# Patient Record
Sex: Male | Born: 1935 | Race: White | Hispanic: Yes | Marital: Married
Health system: Southern US, Community
[De-identification: ages and names within clinical notes are randomized; demographics above are authoritative.]

---

## 2014-11-21 ENCOUNTER — Inpatient Hospital Stay
Admission: AD | Admit: 2014-11-21 | Discharge: 2014-11-27 | Disposition: A | Discharge status: Hospice | Payer: Self-pay | Source: Ambulatory Visit | Attending: Internal Medicine | Admitting: Internal Medicine

## 2014-11-21 DIAGNOSIS — Z4659 Encounter for fitting and adjustment of other gastrointestinal appliance and device: Secondary | ICD-10-CM

## 2014-11-21 DIAGNOSIS — J9690 Respiratory failure, unspecified, unspecified whether with hypoxia or hypercapnia: Secondary | ICD-10-CM

## 2014-11-21 DIAGNOSIS — I509 Heart failure, unspecified: Secondary | ICD-10-CM

## 2014-11-21 DIAGNOSIS — I1310 Hypertensive heart and chronic kidney disease without heart failure, with stage 1 through stage 4 chronic kidney disease, or unspecified chronic kidney disease: Secondary | ICD-10-CM

## 2014-11-21 LAB — BLOOD GAS, ARTERIAL
ACID-BASE EXCESS: 0.9 mmol/L (ref 0.0–2.0)
Bicarbonate: 25 mEq/L — ABNORMAL HIGH (ref 20.0–24.0)
DELIVERY SYSTEMS: POSITIVE
Drawn by: 129711
EXPIRATORY PAP: 8
FIO2: 0.5
INSPIRATORY PAP: 12
O2 SAT: 94.6 %
PCO2 ART: 40.5 mmHg (ref 35.0–45.0)
PO2 ART: 73.6 mmHg — AB (ref 80.0–100.0)
Patient temperature: 98.6
TCO2: 26.3 mmol/L (ref 0–100)
pH, Arterial: 7.407 (ref 7.350–7.450)

## 2014-11-22 ENCOUNTER — Other Ambulatory Visit (HOSPITAL_COMMUNITY): Payer: Self-pay

## 2014-11-22 ENCOUNTER — Institutional Professional Consult (permissible substitution) (HOSPITAL_COMMUNITY): Payer: Self-pay

## 2014-11-22 ENCOUNTER — Other Ambulatory Visit (HOSPITAL_BASED_OUTPATIENT_CLINIC_OR_DEPARTMENT_OTHER): Payer: Medicare Other

## 2014-11-22 DIAGNOSIS — I509 Heart failure, unspecified: Secondary | ICD-10-CM

## 2014-11-22 LAB — CK TOTAL AND CKMB (NOT AT ARMC)
CK TOTAL: 27 U/L — AB (ref 49–397)
CK, MB: 1.1 ng/mL (ref 0.5–5.0)
RELATIVE INDEX: INVALID (ref 0.0–2.5)

## 2014-11-22 LAB — CBC WITH DIFFERENTIAL/PLATELET
BASOS PCT: 0 % (ref 0–1)
Basophils Absolute: 0 10*3/uL (ref 0.0–0.1)
EOS ABS: 0 10*3/uL (ref 0.0–0.7)
Eosinophils Relative: 0 % (ref 0–5)
HCT: 26.6 % — ABNORMAL LOW (ref 39.0–52.0)
Hemoglobin: 8.3 g/dL — ABNORMAL LOW (ref 13.0–17.0)
Lymphocytes Relative: 6 % — ABNORMAL LOW (ref 12–46)
Lymphs Abs: 0.6 10*3/uL — ABNORMAL LOW (ref 0.7–4.0)
MCH: 28.5 pg (ref 26.0–34.0)
MCHC: 31.2 g/dL (ref 30.0–36.0)
MCV: 91.4 fL (ref 78.0–100.0)
Monocytes Absolute: 0.6 10*3/uL (ref 0.1–1.0)
Monocytes Relative: 6 % (ref 3–12)
NEUTROS PCT: 88 % — AB (ref 43–77)
NRBC: 0 /100{WBCs}
Neutro Abs: 8.7 10*3/uL — ABNORMAL HIGH (ref 1.7–7.7)
PLATELETS: 304 10*3/uL (ref 150–400)
RBC: 2.91 MIL/uL — AB (ref 4.22–5.81)
RDW: 19.3 % — ABNORMAL HIGH (ref 11.5–15.5)
WBC: 10 10*3/uL (ref 4.0–10.5)

## 2014-11-22 LAB — MAGNESIUM: Magnesium: 2.6 mg/dL — ABNORMAL HIGH (ref 1.7–2.4)

## 2014-11-22 LAB — COMPREHENSIVE METABOLIC PANEL
ALT: 11 U/L — AB (ref 17–63)
AST: 16 U/L (ref 15–41)
Albumin: 2.9 g/dL — ABNORMAL LOW (ref 3.5–5.0)
Alkaline Phosphatase: 50 U/L (ref 38–126)
Anion gap: 8 (ref 5–15)
BUN: 59 mg/dL — ABNORMAL HIGH (ref 6–20)
CHLORIDE: 105 mmol/L (ref 101–111)
CO2: 28 mmol/L (ref 22–32)
CREATININE: 1.52 mg/dL — AB (ref 0.61–1.24)
Calcium: 8.7 mg/dL — ABNORMAL LOW (ref 8.9–10.3)
GFR calc non Af Amer: 41 mL/min — ABNORMAL LOW (ref >60)
GFR, EST AFRICAN AMERICAN: 47 mL/min — AB (ref >60)
Glucose, Bld: 201 mg/dL — ABNORMAL HIGH (ref 65–99)
Potassium: 3.9 mmol/L (ref 3.5–5.1)
SODIUM: 141 mmol/L (ref 135–145)
Total Bilirubin: 0.9 mg/dL (ref 0.3–1.2)
Total Protein: 6.4 g/dL — ABNORMAL LOW (ref 6.5–8.1)

## 2014-11-22 LAB — URINALYSIS, ROUTINE W REFLEX MICROSCOPIC
BILIRUBIN URINE: NEGATIVE
Glucose, UA: NEGATIVE mg/dL
HGB URINE DIPSTICK: NEGATIVE
Ketones, ur: NEGATIVE mg/dL
Leukocytes, UA: NEGATIVE
NITRITE: NEGATIVE
PROTEIN: NEGATIVE mg/dL
Specific Gravity, Urine: 1.017 (ref 1.005–1.030)
UROBILINOGEN UA: 0.2 mg/dL (ref 0.0–1.0)
pH: 5 (ref 5.0–8.0)

## 2014-11-22 LAB — BLOOD GAS, ARTERIAL
ACID-BASE EXCESS: 0.8 mmol/L (ref 0.0–2.0)
BICARBONATE: 25.1 meq/L — AB (ref 20.0–24.0)
Delivery systems: POSITIVE
Expiratory PAP: 8
FIO2: 0.5
Inspiratory PAP: 12
LHR: 8 {breaths}/min
O2 SAT: 93.4 %
PATIENT TEMPERATURE: 98.6
PCO2 ART: 42 mmHg (ref 35.0–45.0)
PO2 ART: 70.9 mmHg — AB (ref 80.0–100.0)
TCO2: 26.4 mmol/L (ref 0–100)
pH, Arterial: 7.394 (ref 7.350–7.450)

## 2014-11-22 LAB — PROTIME-INR
INR: 1.33 (ref 0.00–1.49)
Prothrombin Time: 16.6 seconds — ABNORMAL HIGH (ref 11.6–15.2)

## 2014-11-22 LAB — PHOSPHORUS: PHOSPHORUS: 4.7 mg/dL — AB (ref 2.5–4.6)

## 2014-11-22 LAB — TROPONIN I
Troponin I: 1.13 ng/mL (ref <0.031)
Troponin I: 1.01 ng/mL (ref <0.031)

## 2014-11-22 LAB — PROCALCITONIN: Procalcitonin: 0.16 ng/mL

## 2014-11-22 LAB — BRAIN NATRIURETIC PEPTIDE: B Natriuretic Peptide: 1532.9 pg/mL — ABNORMAL HIGH (ref 0.0–100.0)

## 2014-11-22 LAB — LACTIC ACID, PLASMA: Lactic Acid, Venous: 1.1 mmol/L (ref 0.5–2.0)

## 2014-11-22 NOTE — Progress Notes (Signed)
  Echocardiogram 2D Echocardiogram has been performed.  Arvil Chaco 11/22/2014, 3:00 PM

## 2014-11-23 ENCOUNTER — Other Ambulatory Visit (HOSPITAL_COMMUNITY): Payer: Self-pay

## 2014-11-23 LAB — URINE CULTURE
Culture: NO GROWTH
Special Requests: NORMAL

## 2014-11-23 LAB — RENAL FUNCTION PANEL
ANION GAP: 6 (ref 5–15)
Albumin: 2.6 g/dL — ABNORMAL LOW (ref 3.5–5.0)
BUN: 46 mg/dL — ABNORMAL HIGH (ref 6–20)
CALCIUM: 8.4 mg/dL — AB (ref 8.9–10.3)
CHLORIDE: 107 mmol/L (ref 101–111)
CO2: 32 mmol/L (ref 22–32)
Creatinine, Ser: 1.22 mg/dL (ref 0.61–1.24)
GFR calc non Af Amer: 53 mL/min — ABNORMAL LOW (ref >60)
Glucose, Bld: 210 mg/dL — ABNORMAL HIGH (ref 65–99)
POTASSIUM: 3.7 mmol/L (ref 3.5–5.1)
Phosphorus: 4.2 mg/dL (ref 2.5–4.6)
SODIUM: 145 mmol/L (ref 135–145)

## 2014-11-23 LAB — HEMOGLOBIN A1C
HEMOGLOBIN A1C: 4.8 % (ref 4.8–5.6)
Mean Plasma Glucose: 91 mg/dL

## 2014-11-25 LAB — RENAL FUNCTION PANEL
ANION GAP: 7 (ref 5–15)
Albumin: 2.4 g/dL — ABNORMAL LOW (ref 3.5–5.0)
BUN: 59 mg/dL — ABNORMAL HIGH (ref 6–20)
CO2: 34 mmol/L — AB (ref 22–32)
Calcium: 8.2 mg/dL — ABNORMAL LOW (ref 8.9–10.3)
Chloride: 111 mmol/L (ref 101–111)
Creatinine, Ser: 1.34 mg/dL — ABNORMAL HIGH (ref 0.61–1.24)
GFR calc non Af Amer: 47 mL/min — ABNORMAL LOW (ref >60)
GFR, EST AFRICAN AMERICAN: 55 mL/min — AB (ref >60)
GLUCOSE: 265 mg/dL — AB (ref 65–99)
POTASSIUM: 4.2 mmol/L (ref 3.5–5.1)
Phosphorus: 2.8 mg/dL (ref 2.5–4.6)
Sodium: 152 mmol/L — ABNORMAL HIGH (ref 135–145)

## 2014-11-25 LAB — CBC WITH DIFFERENTIAL/PLATELET
Basophils Absolute: 0 10*3/uL (ref 0.0–0.1)
Basophils Relative: 0 % (ref 0–1)
Eosinophils Absolute: 0.1 10*3/uL (ref 0.0–0.7)
Eosinophils Relative: 1 % (ref 0–5)
HEMATOCRIT: 31.6 % — AB (ref 39.0–52.0)
HEMOGLOBIN: 9.4 g/dL — AB (ref 13.0–17.0)
LYMPHS ABS: 0.7 10*3/uL (ref 0.7–4.0)
LYMPHS PCT: 6 % — AB (ref 12–46)
MCH: 28.5 pg (ref 26.0–34.0)
MCHC: 29.7 g/dL — AB (ref 30.0–36.0)
MCV: 95.8 fL (ref 78.0–100.0)
MONOS PCT: 5 % (ref 3–12)
Monocytes Absolute: 0.6 10*3/uL (ref 0.1–1.0)
NEUTROS ABS: 10.2 10*3/uL — AB (ref 1.7–7.7)
NEUTROS PCT: 88 % — AB (ref 43–77)
Platelets: 312 10*3/uL (ref 150–400)
RBC: 3.3 MIL/uL — ABNORMAL LOW (ref 4.22–5.81)
RDW: 19.6 % — ABNORMAL HIGH (ref 11.5–15.5)
WBC: 11.6 10*3/uL — ABNORMAL HIGH (ref 4.0–10.5)

## 2014-11-25 LAB — CK TOTAL AND CKMB (NOT AT ARMC)
CK TOTAL: 48 U/L — AB (ref 49–397)
CK, MB: 0.9 ng/mL (ref 0.5–5.0)
RELATIVE INDEX: INVALID (ref 0.0–2.5)

## 2014-11-25 LAB — BRAIN NATRIURETIC PEPTIDE: B Natriuretic Peptide: 1672.3 pg/mL — ABNORMAL HIGH (ref 0.0–100.0)

## 2014-11-25 LAB — TROPONIN I: Troponin I: 0.36 ng/mL — ABNORMAL HIGH (ref <0.031)

## 2014-11-26 ENCOUNTER — Other Ambulatory Visit (HOSPITAL_COMMUNITY): Payer: Self-pay

## 2014-11-26 LAB — CBC WITH DIFFERENTIAL/PLATELET
BASOS PCT: 0 % (ref 0–1)
Basophils Absolute: 0 10*3/uL (ref 0.0–0.1)
EOS ABS: 0.1 10*3/uL (ref 0.0–0.7)
EOS PCT: 1 % (ref 0–5)
HCT: 30.7 % — ABNORMAL LOW (ref 39.0–52.0)
Hemoglobin: 8.9 g/dL — ABNORMAL LOW (ref 13.0–17.0)
LYMPHS ABS: 0.9 10*3/uL (ref 0.7–4.0)
Lymphocytes Relative: 9 % — ABNORMAL LOW (ref 12–46)
MCH: 28.1 pg (ref 26.0–34.0)
MCHC: 29 g/dL — AB (ref 30.0–36.0)
MCV: 96.8 fL (ref 78.0–100.0)
MONO ABS: 0.6 10*3/uL (ref 0.1–1.0)
MONOS PCT: 6 % (ref 3–12)
Neutro Abs: 9.3 10*3/uL — ABNORMAL HIGH (ref 1.7–7.7)
Neutrophils Relative %: 85 % — ABNORMAL HIGH (ref 43–77)
Platelets: 264 10*3/uL (ref 150–400)
RBC: 3.17 MIL/uL — ABNORMAL LOW (ref 4.22–5.81)
RDW: 19.2 % — AB (ref 11.5–15.5)
WBC: 10.9 10*3/uL — ABNORMAL HIGH (ref 4.0–10.5)

## 2014-12-21 DEATH — deceased

## 2016-11-02 IMAGING — DX DG CHEST 1V PORT
1 series · 1 of 1 positions shown · non-contrast
Comparison: None.

CLINICAL DATA: Respiratory failure

EXAM:
PORTABLE CHEST - 1 VIEW

[chest ap]
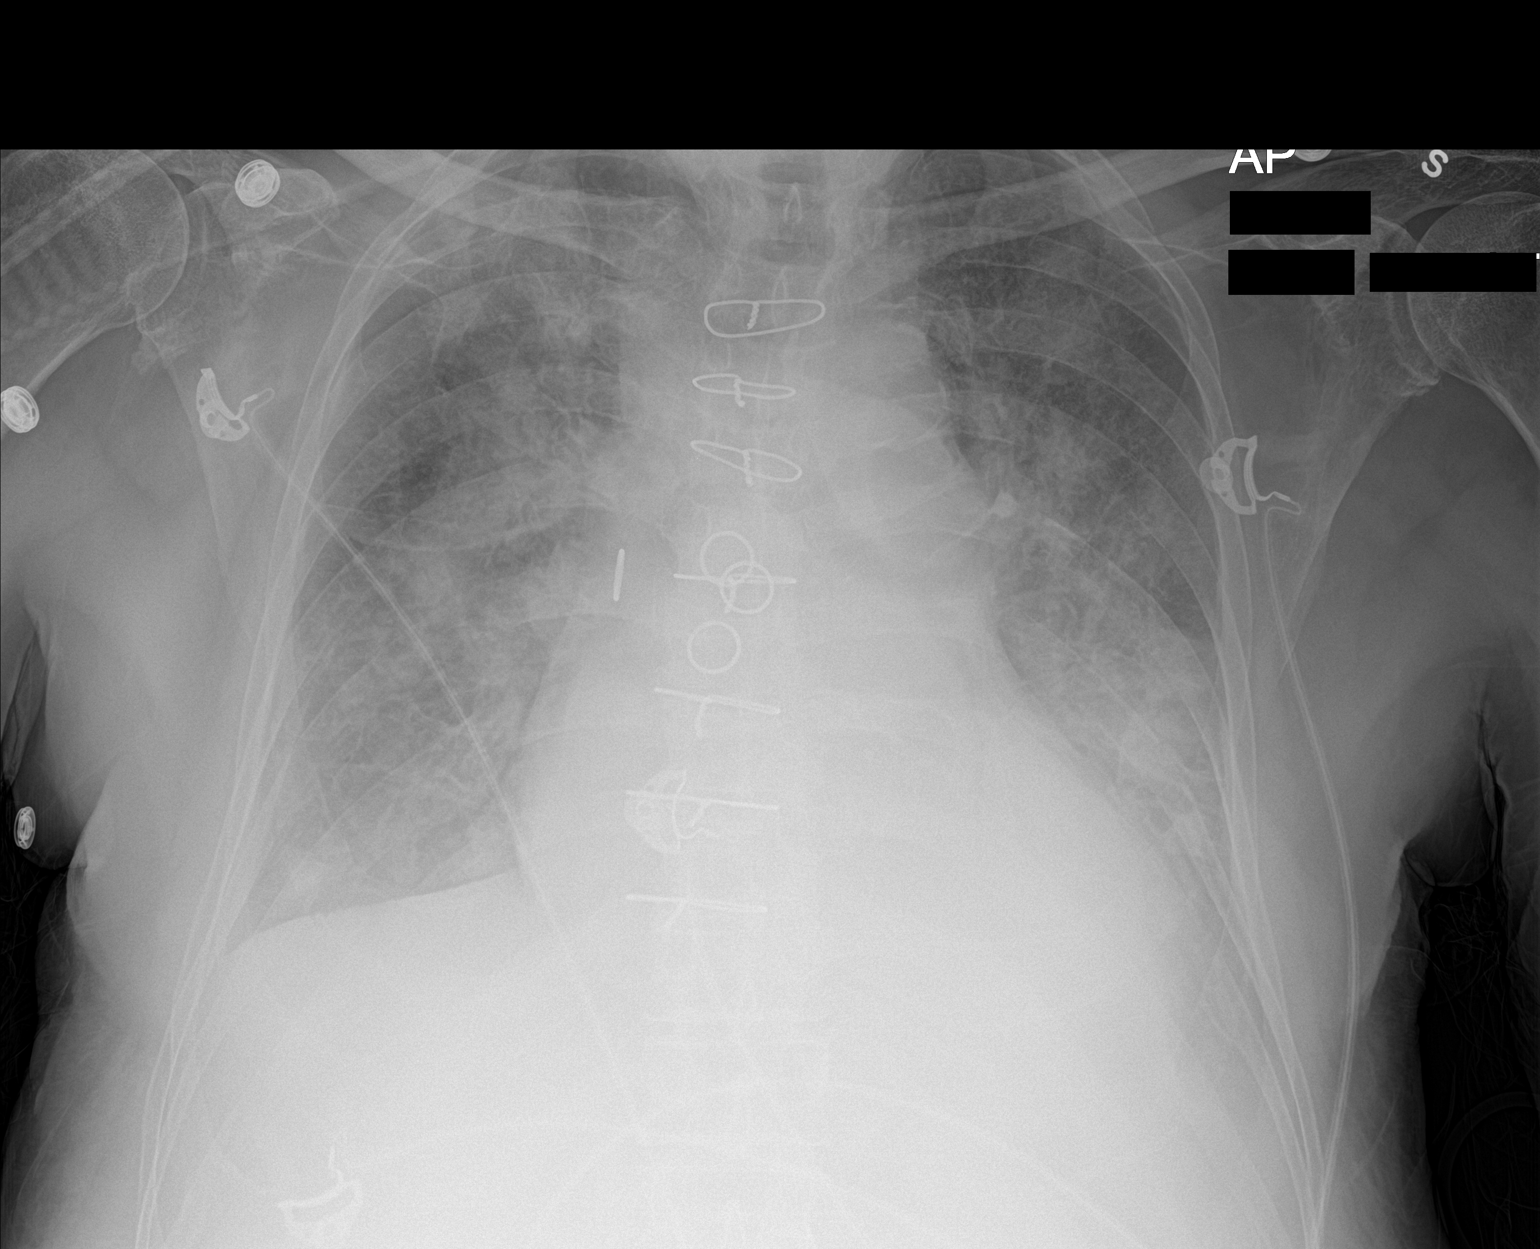

[1 of 1 positions shown; findings below may reference images not displayed]

FINDINGS: There is moderate generalized interstitial and patchy alveolar
edema. There is cardiomegaly with pulmonary venous hypertension. No
adenopathy. Patient is status post coronary artery bypass grafting.
IMPRESSION: Congestive heart failure.
